# Patient Record
Sex: Male | Born: 2000 | Race: White | Hispanic: No | Marital: Single
Health system: Southern US, Community
[De-identification: ages and names within clinical notes are randomized; demographics above are authoritative.]

## PROBLEM LIST (undated history)

## (undated) DIAGNOSIS — R55 Syncope and collapse: Secondary | ICD-10-CM

## (undated) HISTORY — PX: APPENDECTOMY: SHX54

---
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
## 2019-09-23 ENCOUNTER — Other Ambulatory Visit: Payer: Self-pay

## 2019-09-23 ENCOUNTER — Encounter: Payer: Self-pay | Admitting: Medical

## 2019-09-23 ENCOUNTER — Ambulatory Visit (INDEPENDENT_AMBULATORY_CARE_PROVIDER_SITE_OTHER): Payer: BC Managed Care – PPO | Admitting: Medical

## 2019-09-23 VITALS — BP 122/60 | HR 99 | Temp 99.3°F | Ht 64.0 in | Wt 128.2 lb

## 2019-09-23 DIAGNOSIS — Z7689 Persons encountering health services in other specified circumstances: Secondary | ICD-10-CM | POA: Insufficient documentation

## 2019-09-23 DIAGNOSIS — Z87442 Personal history of urinary calculi: Secondary | ICD-10-CM | POA: Diagnosis not present

## 2019-09-23 DIAGNOSIS — Z9049 Acquired absence of other specified parts of digestive tract: Secondary | ICD-10-CM | POA: Diagnosis not present

## 2019-09-23 NOTE — Progress Notes (Signed)
Subjective:  Dakota Wilson is a 19 y.o. male who presents for Chief Complaint  Patient presents with  . New Patient (Initial Visit)    establish care      Here to establish care.  Feeling healthy. Exercises.   Works full time at [employer].  From [place] prior.   No other c/o.  Past Medical History:  Diagnosis Date  . History of kidney stones 2020   once prior    Past Surgical History:  Procedure Laterality Date  . APPENDECTOMY    . CLOSED REDUCTION WRIST FRACTURE Left 08/08/2014   Procedure: CLOSED REDUCTION WRIST ;  Surgeon: Dakota Cover, MD;  Location: [facility];  Service: Orthopedics;  Laterality: Left;    Social History   Socioeconomic History  . Marital status: Single    Spouse name: Not on file  . Number of children: Not on file  . Years of education: Not on file  . Highest education level: Not on file  Occupational History  . Not on file  Tobacco Use  . Smoking status: Passive Smoke Exposure - Never Smoker  . Smokeless tobacco: Never Used  Substance and Sexual Activity  . Alcohol use: Never  . Drug use: Never  . Sexual activity: Not on file  Other Topics Concern  . Not on file  Social History Narrative   Lives with mother, from [place] prior, exercises, works full time at [employer].  09/2019   Social Determinants of Health   Financial Resource Strain:   . Difficulty of Paying Living Expenses:   Food Insecurity:   . Worried About Charity fundraiser in the Last Year:   . Arboriculturist in the Last Year:   Transportation Needs:   . Film/video editor (Medical):   Marland Kitchen Lack of Transportation (Non-Medical):   Physical Activity:   . Days of Exercise per Week:   . Minutes of Exercise per Session:   Stress:   . Feeling of Stress :   Social Connections:   . Frequency of Communication with Friends and Family:   . Frequency of Social Gatherings with Friends and Family:   . Attends Religious Services:   . Active Member of Clubs or  Organizations:   . Attends Archivist Meetings:   Marland Kitchen Marital Status:   Intimate Partner Violence:   . Fear of Current or Ex-Partner:   . Emotionally Abused:   Marland Kitchen Physically Abused:   . Sexually Abused:     Family History  Problem Relation Age of Onset  . COPD Mother   . Cancer Maternal Grandmother        prostate    No current outpatient medications on file.  No Known Allergies   The following portions of the patient's history were reviewed and updated as appropriate: allergies, current medications, past family history, past medical history, past social history, past surgical history and problem list.  ROS Otherwise as in subjective above   Objective: BP 122/60   Pulse 99   Temp 99.3 F (37.4 C)   Ht 5\' 4"  (1.626 m)   Wt 128 lb 3.2 oz (58.2 kg)   SpO2 96%   BMI 22.01 kg/m   General appearance: alert, no distress, well developed, well nourished Neck: supple, no lymphadenopathy, no thyromegaly, no masses Heart: RRR, normal S1, S2, no murmurs Lungs: CTA bilaterally, no wheezes, rhonchi, or rales Pulses: 2+ radial pulses, 2+ pedal pulses, normal cap refill Ext: no edema   Assessment: Encounter Diagnoses  Name Primary?  . Encounter to establish care Yes  . History of kidney stones   . S/P appendectomy      Plan: Healthy, no particular c/o  discussed exercise, diet, well care, sick care, testicular cancer screening  Will try to get copy of prior vaccine records  Dakota Wilson was seen today for new patient (initial visit).  Diagnoses and all orders for this visit:  Encounter to establish care  History of kidney stones  S/P appendectomy    Follow up: prn or fasting physical in a few months

## 2019-09-23 NOTE — Patient Instructions (Signed)
We want to get a copy of vaccine records  Check with family about age of grandpa when he was diagnosed with prostate cancer

## 2019-11-30 IMAGING — CT CT RENAL STONE PROTOCOL
3 of 4 series · 9 of 46 positions shown, 16 images · non-contrast
Comparison: None

CLINICAL DATA: Acute LEFT flank pain since noon today, suspected
stone disease

EXAM:
CT ABDOMEN AND PELVIS WITHOUT CONTRAST
TECHNIQUE: Multidetector CT imaging of the abdomen and pelvis was performed
following the standard protocol without IV contrast. Sagittal and
coronal MPR images reconstructed from axial data set. Oral contrast
was not administered for this indication.

[Series 4: lung bases · axial · 0.62mm/px · z∈[-201,-136]mm · 5 of 21 slices shown, 10 images]
[im 4/21  soft-tissue]
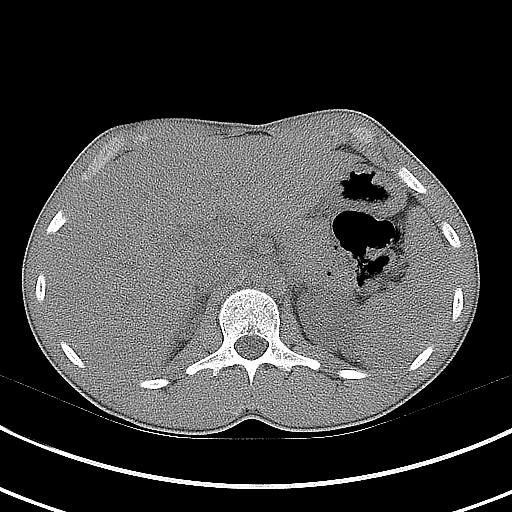
[im 4/21  bone]
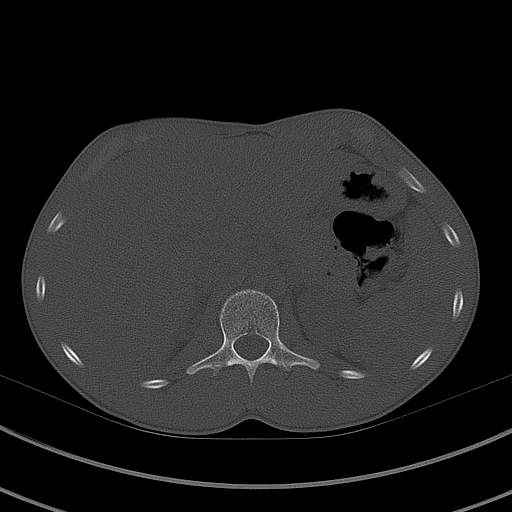
[im 7/21  soft-tissue]
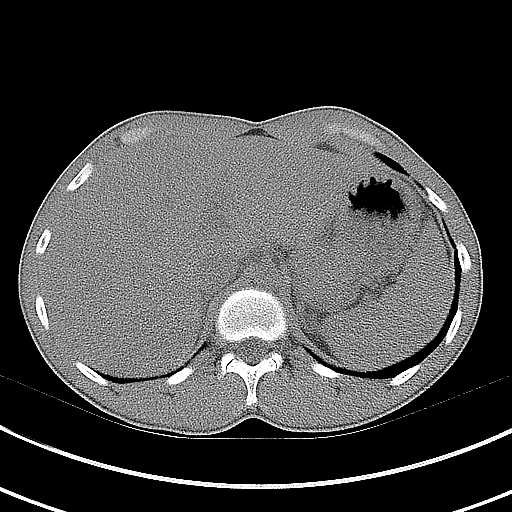
[im 7/21  lung]
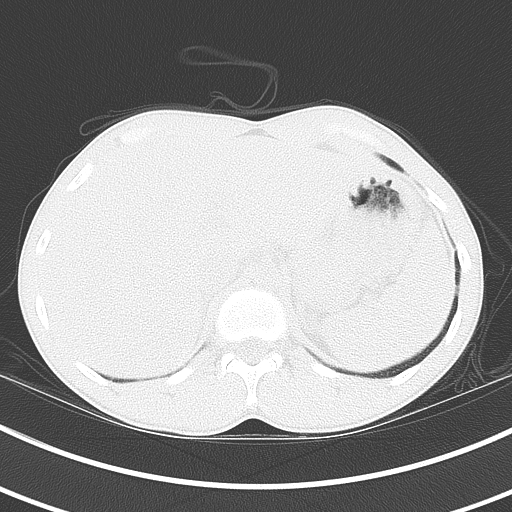
[im 11/21  soft-tissue]
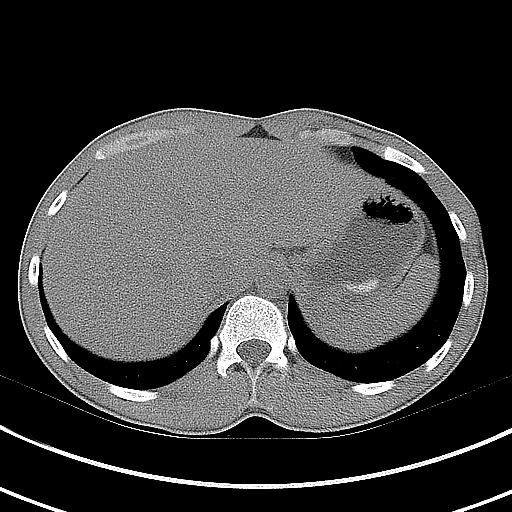
[im 11/21  lung]
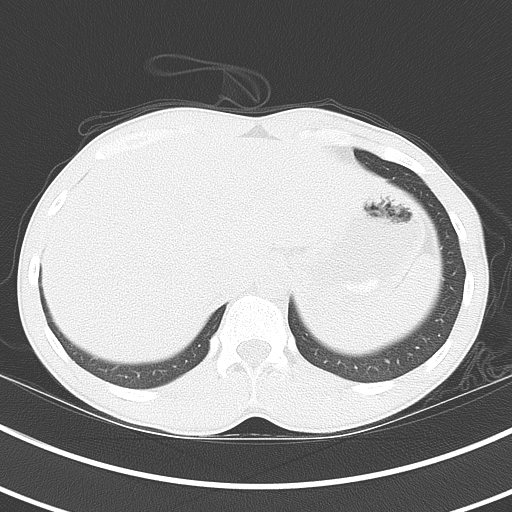
[im 14/21  soft-tissue]
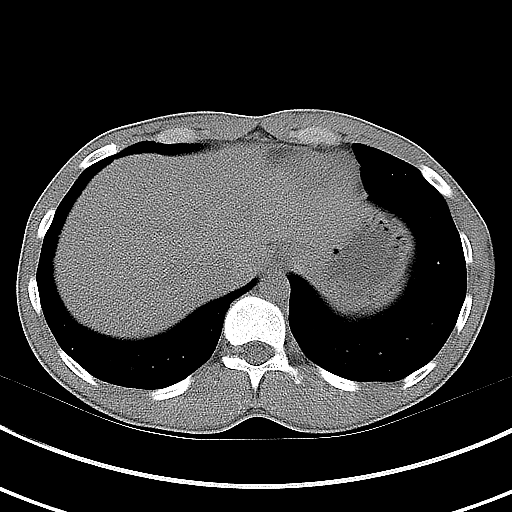
[im 14/21  lung]
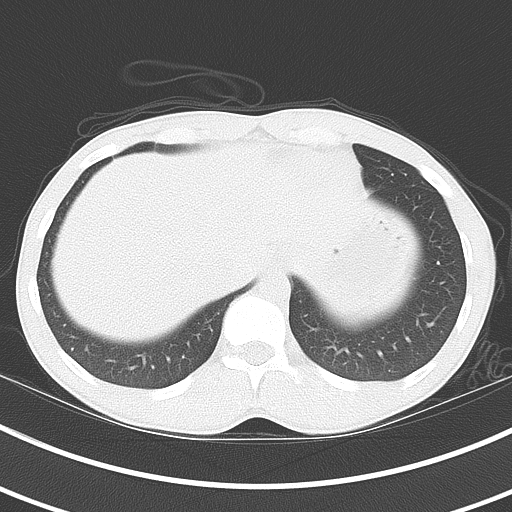
[im 17/21  soft-tissue]
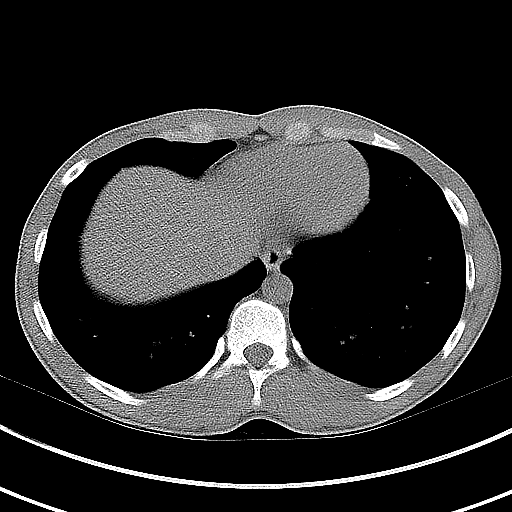
[im 17/21  lung]
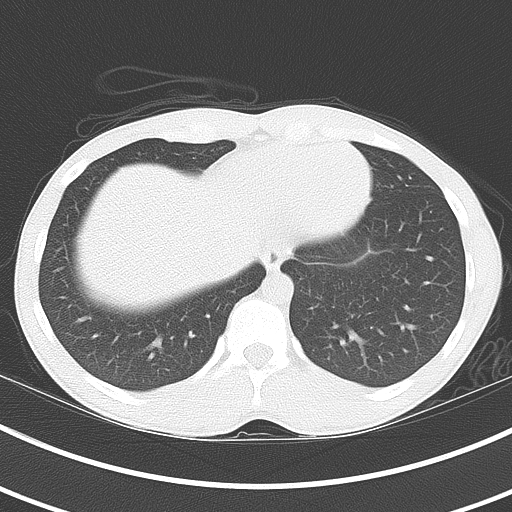

[Series 5: coronal · coronal · 0.56mm/px · 3 of 106 slices shown, 4 images]
[im 36/106  soft-tissue]
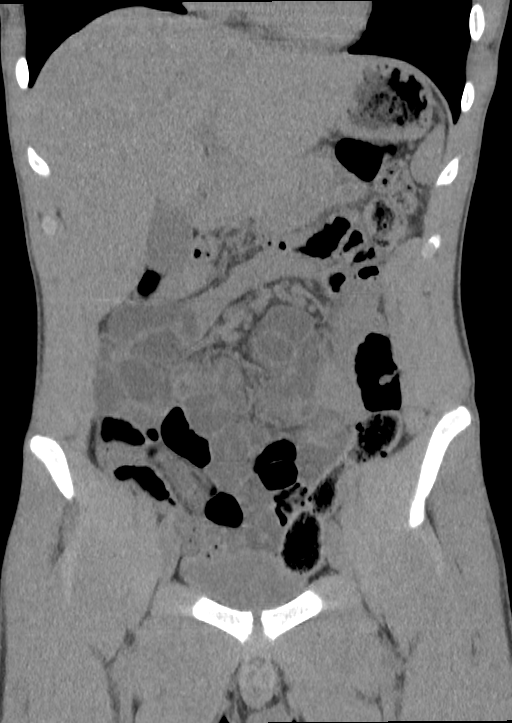
[im 47/106  soft-tissue]
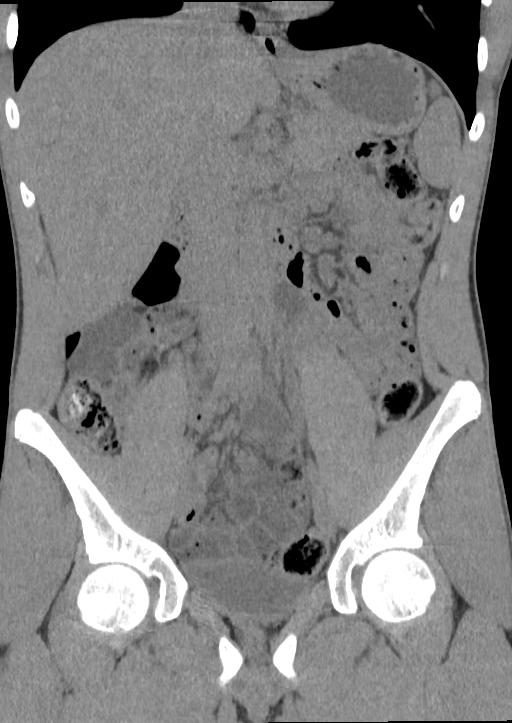
[im 47/106  bone]
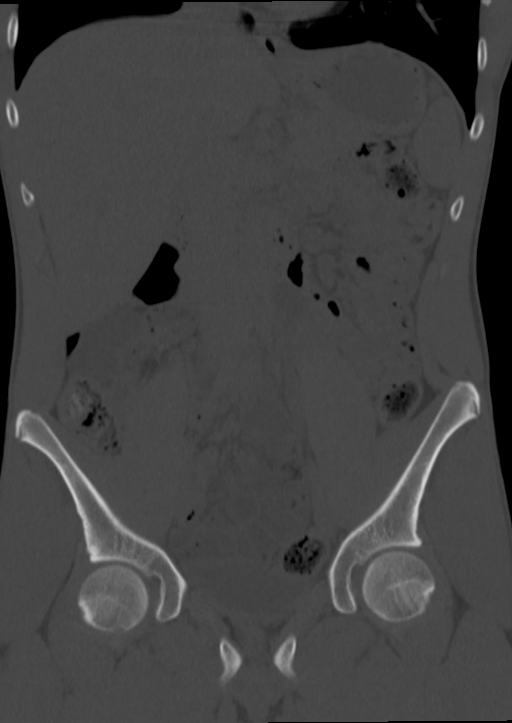
[im 59/106  soft-tissue]
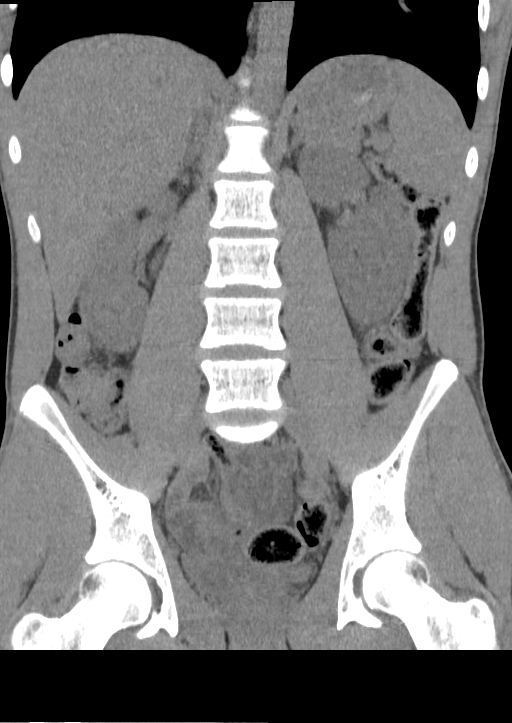

[Series 6: sagittal · sagittal · 0.41mm/px · 1 of 151 slices shown, 2 images]
[im 51/151  soft-tissue]
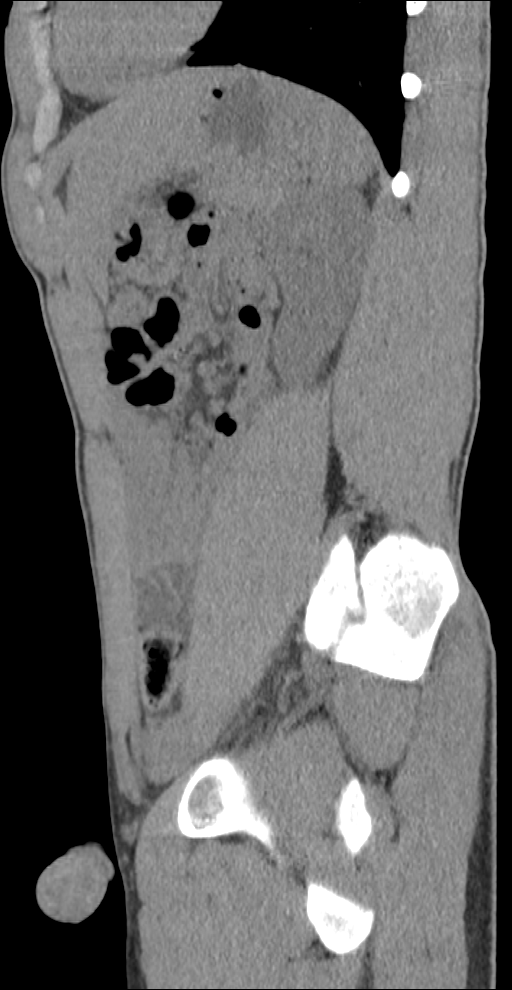
[im 51/151  bone]
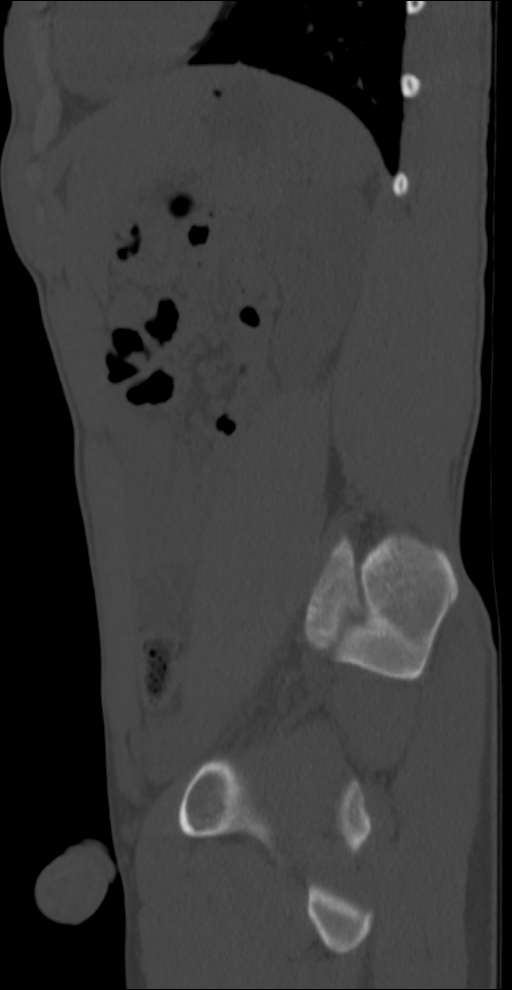

[9 of 46 positions shown; findings below may reference images not displayed]

FINDINGS: Lower chest: Lung bases clear

Hepatobiliary: Gallbladder and liver normal appearance

Pancreas: Grossly normal appearance

Spleen: Normal appearance

Adrenals/Urinary Tract: Adrenal glands normal appearance. No
evidence of renal mass. Mild LEFT hydronephrosis. Ureters are poorly
visualized in their courses to the bladder due to lack of fat
planes. A tiny calculus 1-2 mm diameter however is identified at the
LEFT ureterovesical junction. No additional urinary tract
calcifications or RIGHT hydronephrosis.

Stomach/Bowel: Appendix surgically absent by history. Fluid-filled
small bowel loops. Stomach and remaining bowel loops unremarkable.

Vascular/Lymphatic: Aorta normal caliber. Numerous normal to upper
normal sized mesenteric lymph nodes, nonspecific.

Reproductive: Unremarkable prostate gland

Other: No free air or free fluid. No hernia or acute inflammatory
process.

Musculoskeletal: Osseous structures unremarkable
IMPRESSION: Mild LEFT hydronephrosis with note of a 1-2 mm diameter calculus at
the LEFT ureterovesical junction.

Nonspecific normal to upper normal sized mesenteric lymph nodes.

No additional intra-abdominal or intrapelvic abnormalities.

## 2021-12-17 ENCOUNTER — Emergency Department: Payer: Managed Care, Other (non HMO)

## 2021-12-17 ENCOUNTER — Emergency Department
Admission: EM | Admit: 2021-12-17 | Discharge: 2021-12-17 | Discharge status: Against Medical Advice | Payer: Managed Care, Other (non HMO) | Attending: Emergency Medicine | Admitting: Emergency Medicine

## 2021-12-17 ENCOUNTER — Other Ambulatory Visit: Payer: Self-pay

## 2021-12-17 DIAGNOSIS — S6991XA Unspecified injury of right wrist, hand and finger(s), initial encounter: Secondary | ICD-10-CM | POA: Diagnosis present

## 2021-12-17 DIAGNOSIS — S61216A Laceration without foreign body of right little finger without damage to nail, initial encounter: Secondary | ICD-10-CM | POA: Diagnosis not present

## 2021-12-17 DIAGNOSIS — W230XXA Caught, crushed, jammed, or pinched between moving objects, initial encounter: Secondary | ICD-10-CM | POA: Insufficient documentation

## 2021-12-17 DIAGNOSIS — Z5321 Procedure and treatment not carried out due to patient leaving prior to being seen by health care provider: Secondary | ICD-10-CM | POA: Diagnosis not present

## 2021-12-17 NOTE — ED Triage Notes (Signed)
Pt presents to ER stating he slammed his finger in a door when shutting it today appx 30 minutes pta.  Pt has lac to right pinky finger with some discoloration noted.  Pt able to move finger without difficulty.  No other injuries noted.  Pt A&O x4 at this time in NAD.

## 2022-02-21 ENCOUNTER — Encounter: Payer: Self-pay | Admitting: Internal Medicine
